# Patient Record
Sex: Female | Born: 1985 | Race: White | Hispanic: No | Marital: Single | Smoking: Never smoker
Health system: Southern US, Community
[De-identification: ages and names within clinical notes are randomized; demographics above are authoritative.]

---

## 2014-01-30 ENCOUNTER — Ambulatory Visit (INDEPENDENT_AMBULATORY_CARE_PROVIDER_SITE_OTHER): Payer: BC Managed Care – PPO | Admitting: Family Medicine

## 2014-01-30 VITALS — BP 116/63 | HR 46 | Temp 98.6°F | Resp 14 | Ht 65.0 in | Wt 128.8 lb

## 2014-01-30 DIAGNOSIS — Z0000 Encounter for general adult medical examination without abnormal findings: Secondary | ICD-10-CM

## 2014-01-30 DIAGNOSIS — Z113 Encounter for screening for infections with a predominantly sexual mode of transmission: Secondary | ICD-10-CM

## 2014-01-30 DIAGNOSIS — Z124 Encounter for screening for malignant neoplasm of cervix: Secondary | ICD-10-CM

## 2014-01-30 NOTE — Progress Notes (Signed)
Subjective:  This chart was scribed for Amanda Sorenson, MD by Andrew Au, ED Scribe. This patient was seen in room 8 and the patient's care was started at 12:23 PM.  Patient ID: Amanda Bass, female    DOB: April 08, 1986, 28 y.o.   MRN: [id number]  HPI Chief Complaint  Patient presents with  . Annual Exam    insurance physical   HPI Comments: Amanda Bass is a 28 y.o. female who presents to the [facility] for a insurance physical. Pt is new to the area and does not have a physician in the area. Pt denies hospitalization and surgeries. She denies cancers in her family.  Pt last pap smear was 3 years ago which was normal. Pt is on mirena IUD.  Pt takes multivitamin gummies Pt is unsure of last TDAP. Pt is unsure if she UTD on gardisil.  Pt has not had blood work in a while   Pt has eaten today.   History reviewed. No pertinent past medical history. History reviewed. No pertinent past surgical history. No current outpatient prescriptions on file prior to visit.   No current facility-administered medications on file prior to visit.   Allergies  Allergen Reactions  . Amoxicillin Hives  . Lotrimin [Clotrimazole] Hives   History reviewed. No pertinent family history. History   Social History  . Marital Status: Single    Spouse Name: N/A    Number of Children: N/A  . Years of Education: N/A   Social History Main Topics  . Smoking status: Never Smoker   . Smokeless tobacco: Never Used  . Alcohol Use: Yes  . Drug Use: No  . Sexual Activity: None   Other Topics Concern  . None   Social History Narrative  . None    Review of Systems  All other systems reviewed and are negative.  BP 116/63  Pulse 46  Temp(Src) 98.6 F (37 C) (Oral)  Resp 14  Ht 5\' 5"  (1.651 m)  Wt 128 lb 12.8 oz (58.423 kg)  BMI 21.43 kg/m2  SpO2 100% Objective:   Physical Exam  Nursing note and vitals reviewed. Constitutional: She is oriented to person, place, and  time. She appears well-developed and well-nourished. No distress.  HENT:  Head: Normocephalic and atraumatic.  Right Ear: Hearing, tympanic membrane, external ear and ear canal normal.  Left Ear: Hearing, tympanic membrane, external ear and ear canal normal.  Mouth/Throat: Uvula is midline, oropharynx is clear and moist and mucous membranes are normal.  Eyes: Conjunctivae and EOM are normal. Pupils are equal, round, and reactive to light.  Neck: Normal range of motion. Neck supple.  Cardiovascular: Normal rate, regular rhythm, S1 normal, S2 normal and normal heart sounds.  Exam reveals no friction rub.   No murmur heard. Pulmonary/Chest: Effort normal and breath sounds normal. No respiratory distress. She has no wheezes. She has no rales. She exhibits no tenderness.  Abdominal: Soft. Bowel sounds are normal. She exhibits no distension and no mass. There is no tenderness. There is no rebound and no guarding.  Genitourinary: Vagina normal and uterus normal. No breast swelling, tenderness, discharge or bleeding. Cervix exhibits no motion tenderness, no discharge and no friability. Right adnexum displays no mass, no tenderness and no fullness. Left adnexum displays no mass, no tenderness and no fullness.  IUD string are in place.  Musculoskeletal: Normal range of motion.  Lymphadenopathy:    She has no cervical adenopathy.  Neurological: She is alert and  oriented to person, place, and time.  Skin: Skin is warm and dry.  Psychiatric: She has a normal mood and affect. Her behavior is normal.   Assessment & Plan:  Routine general medical examination at a health care facility  Screening for cervical cancer - Plan: Pap IG, CT/NG w/ reflex HPV when ASC-U  Screening for STD (sexually transmitted disease) - Plan: Pap IG, CT/NG w/ reflex HPV when ASC-U    I personally performed the services described in this documentation, which was scribed in my presence. The recorded information has been reviewed  and considered, and addended by me as needed.  Amanda SorensonEva Oniel Meleski, MD MPH

## 2014-01-30 NOTE — Patient Instructions (Signed)
We tested for precancerous changes in your cervix as well as gonorrhea, chlamydia, bacterial vaginosis, trichomonas, and yeast. We did NOT do the blood tests for HIV, syphilis, herpes, or hepatitis. If you want to have these blood tests done at any time don't hesitate to come back. There are several good gyn practices in town but in 2 years when you need your IUD changed you can ask your family and friends. I think that [facility] and the other providers she works with are the best.  Keeping You Healthy  Get These Tests 1. Blood Pressure- Have your blood pressure checked once a year by your health care provider.  Normal blood pressure is 120/80. 2. Weight- Have your body mass index (BMI) calculated to screen for obesity.  BMI is measure of body fat based on height and weight.  You can also calculate your own BMI at https://www.west-esparza.com/www.nhlbisupport.com/bmi/. 3. Cholesterol- Have your cholesterol checked every 5 years starting at age 27 then yearly starting at age 27. 4. Chlamydia, HIV, and other sexually transmitted diseases- Get screened every year until age 27, then within three months of each new sexual provider. 5. Pap Smear- Every 1-3 years; discuss with your health care provider. 6. Mammogram- Every year starting at age 27  Take these medicines  Calcium with Vitamin D-Your body needs 1200 mg of Calcium each day and [phone] IU of Vitamin D daily.  Your body can only absorb 500 mg of Calcium at a time so Calcium must be taken in 2 or 3 divided doses throughout the day.  Multivitamin with folic acid- Once daily if it is possible for you to become pregnant.  Get these Immunizations  Gardasil-Series of three doses; prevents HPV related illness such as genital warts and cervical cancer.  Menactra-Single dose; prevents meningitis.  Tetanus shot- Every 10 years.  Flu shot-Every year.  Take these steps 1. Do not smoke-Your healthcare provider can help you quit.  For tips  on how to quit go to www.smokefree.gov or call 1-800 QUITNOW. 2. Be physically active- Exercise 5 days a week for at least 30 minutes.  If you are not already physically active, start slow and gradually work up to 30 minutes of moderate physical activity.  Examples of moderate activity include walking briskly, dancing, swimming, bicycling, etc. 3. Breast Cancer- A self breast exam every month is important for early detection of breast cancer.  For more information and instruction on self breast exams, ask your healthcare provider or [place].eswww.womenshealth.gov/faq/breast-self-exam.cfm. 4. Eat a healthy diet- Eat a variety of healthy foods such as fruits, vegetables, whole grains, low fat milk, low fat cheeses, yogurt, lean meats, poultry and fish, beans, nuts, tofu, etc.  For more information go to www. Thenutritionsource.org 5. Drink alcohol in moderation- Limit alcohol intake to one drink or less per day. Never drink and drive. 6. Depression- Your emotional health is as important as your physical health.  If you're feeling down or losing interest in things you normally enjoy please talk to your healthcare provider about being screened for depression. 7. Dental visit- Brush and floss your teeth twice daily; visit your dentist twice a year. 8. Eye doctor- Get an eye exam at least every 2 years. 9. Helmet use- Always wear a helmet when riding a bicycle, motorcycle, rollerblading or skateboarding. 10. Safe sex- If you may be exposed to sexually transmitted infections, use a condom. 11. Seat belts- Seat belts can save your live; always wear one. 12. Smoke/Carbon Monoxide  detectors- These detectors need to be installed on the appropriate level of your home. Replace batteries at least once a year. 13. Skin cancer- When out in the sun please cover up and use sunscreen 15 SPF or higher. 14. Violence- If anyone is threatening or hurting you, please tell your healthcare provider.

## 2014-01-31 LAB — PAP IG, CT-NG, RFX HPV ASCU
Chlamydia Probe Amp: NEGATIVE
GC PROBE AMP: NEGATIVE

## 2014-02-07 ENCOUNTER — Encounter: Payer: Self-pay | Admitting: Family Medicine

## 2016-09-18 DIAGNOSIS — S6990XA Unspecified injury of unspecified wrist, hand and finger(s), initial encounter: Secondary | ICD-10-CM | POA: Diagnosis not present

## 2016-09-18 DIAGNOSIS — R21 Rash and other nonspecific skin eruption: Secondary | ICD-10-CM | POA: Diagnosis not present

## 2016-09-26 DIAGNOSIS — L42 Pityriasis rosea: Secondary | ICD-10-CM | POA: Diagnosis not present

## 2016-09-27 DIAGNOSIS — M542 Cervicalgia: Secondary | ICD-10-CM | POA: Diagnosis not present

## 2016-09-27 DIAGNOSIS — G5621 Lesion of ulnar nerve, right upper limb: Secondary | ICD-10-CM | POA: Diagnosis not present

## 2016-09-27 DIAGNOSIS — G5622 Lesion of ulnar nerve, left upper limb: Secondary | ICD-10-CM | POA: Diagnosis not present

## 2016-09-27 DIAGNOSIS — M79644 Pain in right finger(s): Secondary | ICD-10-CM | POA: Diagnosis not present

## 2016-09-30 ENCOUNTER — Other Ambulatory Visit: Payer: Self-pay | Admitting: Orthopedic Surgery

## 2016-09-30 DIAGNOSIS — S63408A Traumatic rupture of unspecified ligament of other finger at metacarpophalangeal and interphalangeal joint, initial encounter: Secondary | ICD-10-CM

## 2016-10-17 ENCOUNTER — Other Ambulatory Visit: Payer: Self-pay

## 2016-11-05 ENCOUNTER — Inpatient Hospital Stay
Admission: RE | Admit: 2016-11-05 | Discharge: 2016-11-05 | Disposition: A | Discharge status: Home / Self Care | Payer: Self-pay | Source: Ambulatory Visit | Attending: Orthopedic Surgery | Admitting: Orthopedic Surgery

## 2016-11-05 ENCOUNTER — Other Ambulatory Visit: Payer: Self-pay

## 2016-11-14 ENCOUNTER — Other Ambulatory Visit: Payer: Self-pay | Admitting: Obstetrics & Gynecology

## 2016-11-14 ENCOUNTER — Other Ambulatory Visit (HOSPITAL_COMMUNITY)
Admission: RE | Admit: 2016-11-14 | Discharge: 2016-11-14 | Disposition: A | Discharge status: Home / Self Care | Payer: BLUE CROSS/BLUE SHIELD | Source: Ambulatory Visit | Attending: Obstetrics & Gynecology | Admitting: Obstetrics & Gynecology

## 2016-11-14 DIAGNOSIS — Z1151 Encounter for screening for human papillomavirus (HPV): Secondary | ICD-10-CM | POA: Insufficient documentation

## 2016-11-14 DIAGNOSIS — Z01419 Encounter for gynecological examination (general) (routine) without abnormal findings: Secondary | ICD-10-CM | POA: Insufficient documentation

## 2016-11-21 LAB — CYTOLOGY - PAP
Diagnosis: NEGATIVE
HPV (WINDOPATH): NOT DETECTED

## 2017-11-20 DIAGNOSIS — Z01419 Encounter for gynecological examination (general) (routine) without abnormal findings: Secondary | ICD-10-CM | POA: Diagnosis not present

## 2017-12-17 DIAGNOSIS — M79605 Pain in left leg: Secondary | ICD-10-CM | POA: Diagnosis not present

## 2017-12-26 DIAGNOSIS — M79605 Pain in left leg: Secondary | ICD-10-CM | POA: Diagnosis not present

## 2018-01-01 DIAGNOSIS — M79605 Pain in left leg: Secondary | ICD-10-CM | POA: Diagnosis not present

## 2018-02-19 DIAGNOSIS — M79605 Pain in left leg: Secondary | ICD-10-CM | POA: Diagnosis not present

## 2018-02-26 DIAGNOSIS — M79605 Pain in left leg: Secondary | ICD-10-CM | POA: Diagnosis not present

## 2018-03-05 DIAGNOSIS — M79605 Pain in left leg: Secondary | ICD-10-CM | POA: Diagnosis not present

## 2018-03-12 DIAGNOSIS — M79605 Pain in left leg: Secondary | ICD-10-CM | POA: Diagnosis not present

## 2018-03-19 DIAGNOSIS — M79605 Pain in left leg: Secondary | ICD-10-CM | POA: Diagnosis not present

## 2018-05-22 ENCOUNTER — Ambulatory Visit (INDEPENDENT_AMBULATORY_CARE_PROVIDER_SITE_OTHER): Payer: BLUE CROSS/BLUE SHIELD | Admitting: Family Medicine

## 2018-05-22 ENCOUNTER — Other Ambulatory Visit (INDEPENDENT_AMBULATORY_CARE_PROVIDER_SITE_OTHER): Payer: Self-pay

## 2018-05-22 ENCOUNTER — Ambulatory Visit (INDEPENDENT_AMBULATORY_CARE_PROVIDER_SITE_OTHER): Payer: Self-pay

## 2018-05-22 ENCOUNTER — Encounter (INDEPENDENT_AMBULATORY_CARE_PROVIDER_SITE_OTHER): Payer: Self-pay | Admitting: Family Medicine

## 2018-05-22 ENCOUNTER — Other Ambulatory Visit (INDEPENDENT_AMBULATORY_CARE_PROVIDER_SITE_OTHER): Payer: Self-pay | Admitting: Family Medicine

## 2018-05-22 DIAGNOSIS — M25561 Pain in right knee: Secondary | ICD-10-CM

## 2018-05-22 DIAGNOSIS — M545 Low back pain: Secondary | ICD-10-CM | POA: Diagnosis not present

## 2018-05-22 DIAGNOSIS — M25562 Pain in left knee: Principal | ICD-10-CM

## 2018-05-22 NOTE — Progress Notes (Signed)
Office Visit Note   Patient: Amanda Bass           Date of Birth: [birth year]           MRN: [id number] Visit Date: 05/22/2018 Requested by: No referring provider defined for this encounter. PCP: Blair HeysEhinger, Robert, MD  Subjective: Chief Complaint  Patient presents with  . Right Knee - Pain    Pain x 1 year and worsening - started with running.  . Left Knee - Pain    Pain x 1 year and worsening.  Did have acute, sharp pains in lateral knee approximately 7 months ago.  That pain got better.    HPI: She is a 32 year old seen at the request of Kathlyn SacramentoLogan Barbour for bilateral thigh pain.  Symptoms started about a year ago on a mild basis, no injury.  She is a runner and has been doing this for the past 5 years, but never had any pain until now.  Without any change in her activities, she started feeling pain in the lateral thigh of both legs radiating toward the knees.  In the summer, her pain became acutely worse so she stopped her running activities and started treatment with physical therapy.  She stayed away from running for about 5 months and her pain persisted.  She has not had any swelling in her knees or other joints, no pain in her hip joints, no pain in her lumbar spine.  No other unusual symptoms.  She tried different running shoes with no improvement.  She states that the pain occurs whether sitting, standing, or moving.  She feels better when she is lying down.  No fevers, chills, unintentional weight change.  No history of medical conditions in the past.  No family history of rheumatologic disease or other worrisome abnormalities.               ROS: Other systems were reviewed and are negative.  Objective: Vital Signs: There were no vitals taken for this visit.  Physical Exam:  Low back: No scoliosis, good flexibility.  Negative stork test.  No tenderness along the lumbar spine or SI joints, or in the sciatic notch areas.  Lower extremity strength and reflexes are normal. Hips:  Good range of motion with internal and external rotation.  No pain with these maneuvers.  No tenderness over the greater trochanter. Knees: Normal Q angles, slight lateral patellar tethering.  Ligaments are stable, no joint effusions.  No joint line tenderness and no pain or click with McMurray's bilaterally.  Both legs are tender at the mid to distal iliotibial band but not at the lateral femoral condyles.  Pain stops before that.  Imaging: X-rays lumbar spine: Normal disc spaces, normal alignment, no congenital deformities, no spondylolisthesis.  No sign of neoplasm or other abnormality.  X-rays of both femurs: Hip joints look good, some tiny calcifications on the lateral aspect of both which could represent small labral tears.  No sign of stress fracture in the femurs.  No sign of neoplasm.    Assessment & Plan: 1.  Bilateral lateral thigh pain, etiology uncertain.  Could be iliotibial band but is not a typical presentation. -She had some labs drawn last year and will send me those results.  Depending on what was drawn, we might draw some more.  If all are unrevealing, then possibly additional work-up such as MRI scan.   Follow-Up Instructions: No follow-ups on file.      Procedures: No procedures performed  No  notes on file    PMFS History: There are no active problems to display for this patient.  History reviewed. No pertinent past medical history.  History reviewed. No pertinent family history.  History reviewed. No pertinent surgical history. Social History   Occupational History  . Not on file  Tobacco Use  . Smoking status: Never Smoker  . Smokeless tobacco: Never Used  Substance and Sexual Activity  . Alcohol use: Yes  . Drug use: No  . Sexual activity: Not on file

## 2018-05-23 ENCOUNTER — Telehealth (INDEPENDENT_AMBULATORY_CARE_PROVIDER_SITE_OTHER): Payer: Self-pay | Admitting: Family Medicine

## 2018-05-23 LAB — THYROID PANEL WITH TSH
Free Thyroxine Index: 2.5 (ref 1.4–3.8)
T3 Uptake: 33 % (ref 22–35)
T4, Total: 7.6 ug/dL (ref 5.1–11.9)
TSH: 1.14 mIU/L

## 2018-05-23 LAB — VITAMIN D 25 HYDROXY (VIT D DEFICIENCY, FRACTURES): Vit D, 25-Hydroxy: 18 ng/mL — ABNORMAL LOW (ref 30–100)

## 2018-05-23 LAB — C-REACTIVE PROTEIN: CRP: <0.2 mg/L (ref <8.0)

## 2018-05-23 LAB — RHEUMATOID FACTOR: Rheumatoid fact SerPl-aCnc: <14 IU/mL (ref <14)

## 2018-05-23 NOTE — Telephone Encounter (Signed)
Vitamin D is low at 18.  We want this to be in the 50-80 range.  This could be contributing to pain.  I recommend taking vitamin D3, 5,000 IU tablets, two daily (10,000 IU total) for 3 months and then 1 daily long-term after that.  Recheck level in 3-6 months to be sure it's in range.  ANA still pending.  Others normal so far.

## 2018-05-25 LAB — ANA: Anti Nuclear Antibody(ANA): NEGATIVE

## 2018-05-26 ENCOUNTER — Telehealth (INDEPENDENT_AMBULATORY_CARE_PROVIDER_SITE_OTHER): Payer: Self-pay | Admitting: Family Medicine

## 2018-05-26 NOTE — Telephone Encounter (Signed)
Final result (ANA) was negative/normal.   

## 2018-07-28 ENCOUNTER — Encounter (INDEPENDENT_AMBULATORY_CARE_PROVIDER_SITE_OTHER): Payer: Self-pay | Admitting: Family Medicine

## 2018-07-28 DIAGNOSIS — E559 Vitamin D deficiency, unspecified: Secondary | ICD-10-CM

## 2018-07-30 NOTE — Telephone Encounter (Signed)
Patient came by the office to get her Vitamin D drawn per Dr. Prince RomeHilts.  When patient arrived, she was not on the schedule as a nurse visit and was advised to come by the office on Friday.  Patient stated that she may not be able to come back by tomorrow and requested that someone message her when she needs to come by.  Thank you.

## 2018-07-31 ENCOUNTER — Ambulatory Visit (INDEPENDENT_AMBULATORY_CARE_PROVIDER_SITE_OTHER): Payer: BLUE CROSS/BLUE SHIELD

## 2018-07-31 DIAGNOSIS — E559 Vitamin D deficiency, unspecified: Secondary | ICD-10-CM

## 2018-07-31 NOTE — Progress Notes (Signed)
Vitamin D Hydroxy 25 drawn per Dr. Prince Rome.

## 2018-07-31 NOTE — Addendum Note (Signed)
Addended by: Lillia Carmel on: 07/31/2018 07:40 AM   Modules accepted: Orders

## 2018-08-01 ENCOUNTER — Telehealth (INDEPENDENT_AMBULATORY_CARE_PROVIDER_SITE_OTHER): Payer: Self-pay | Admitting: Family Medicine

## 2018-08-01 DIAGNOSIS — M25552 Pain in left hip: Secondary | ICD-10-CM

## 2018-08-01 DIAGNOSIS — M79605 Pain in left leg: Secondary | ICD-10-CM

## 2018-08-01 LAB — VITAMIN D 25 HYDROXY (VIT D DEFICIENCY, FRACTURES): Vit D, 25-Hydroxy: 46 ng/mL (ref 30–100)

## 2018-08-01 NOTE — Telephone Encounter (Signed)
Vitamin D is much better at 46.

## 2018-08-03 NOTE — Addendum Note (Signed)
Addended by: Lillia Carmel on: 08/03/2018 08:27 AM   Modules accepted: Orders

## 2018-08-15 ENCOUNTER — Ambulatory Visit
Admission: RE | Admit: 2018-08-15 | Discharge: 2018-08-15 | Disposition: A | Discharge status: Home / Self Care | Payer: BLUE CROSS/BLUE SHIELD | Source: Ambulatory Visit | Attending: Family Medicine | Admitting: Family Medicine

## 2018-08-15 DIAGNOSIS — R600 Localized edema: Secondary | ICD-10-CM | POA: Diagnosis not present

## 2018-08-15 DIAGNOSIS — M79605 Pain in left leg: Secondary | ICD-10-CM

## 2018-08-15 DIAGNOSIS — M25552 Pain in left hip: Secondary | ICD-10-CM | POA: Diagnosis not present

## 2018-08-17 ENCOUNTER — Telehealth (INDEPENDENT_AMBULATORY_CARE_PROVIDER_SITE_OTHER): Payer: Self-pay | Admitting: Family Medicine

## 2018-08-17 NOTE — Telephone Encounter (Signed)
MRI shows normal-appearing hip joint.  There is soft tissue swelling/edema where the IT band crosses over the femur bone.  This could be the source of pain.

## 2018-08-21 ENCOUNTER — Encounter (INDEPENDENT_AMBULATORY_CARE_PROVIDER_SITE_OTHER): Payer: Self-pay | Admitting: Family Medicine

## 2018-08-21 ENCOUNTER — Ambulatory Visit (INDEPENDENT_AMBULATORY_CARE_PROVIDER_SITE_OTHER): Payer: BLUE CROSS/BLUE SHIELD | Admitting: Family Medicine

## 2018-08-21 DIAGNOSIS — M25552 Pain in left hip: Secondary | ICD-10-CM

## 2018-08-21 DIAGNOSIS — M79604 Pain in right leg: Secondary | ICD-10-CM | POA: Diagnosis not present

## 2018-08-21 DIAGNOSIS — M79605 Pain in left leg: Secondary | ICD-10-CM

## 2018-08-21 NOTE — Progress Notes (Signed)
   Office Visit Note   Patient: Amanda Bass           Date of Birth: 13-Jan-1986           MRN: [id number] Visit Date: 08/21/2018 Requested by: Blair Heys, MD [address] [place], [place] [address] PCP: Blair Heys, MD  Subjective: Chief Complaint  Patient presents with  . bilateral hip pain    HPI: She is here for follow-up chronic left greater than right leg pain.  Recent MRI of her left hip showed some soft tissue swelling near the greater trochanter possibly related to IT band friction.  Otherwise unremarkable.  She continues to have left greater than right pain in her legs, generally it starts in the left anterior lateral distal thigh and then involves the lateral aspect of her hip above the greater trochanter.  She is no longer able to run for distance because of her pain.  She is very frustrated by her chronic symptoms.  Denies any low back pain, has not noticed any numbness or weakness in the legs, no symptoms below the knees.              ROS: Noncontributory  Objective: Vital Signs: There were no vitals taken for this visit.  Physical Exam:  Low back: No tenderness to palpation lumbar spine.  Stork test is negative.  Lower extremity strength and reflexes are normal except for left ankle dorsiflexion which is weak at 4/5. Hips: Slightly tender at the greater trochanter.  Slightly tender near the tensor fascia lata muscles.   Imaging: None today.  Assessment & Plan: 1.  Persistent left greater than right leg pain with left ankle dorsiflexion weakness, now suspicious for lumbar foraminal stenosis -Elected to pursue lumbar MRI scan.  Follow-up after that to go over the results.     Procedures: No procedures performed  No notes on file     PMFS History: There are no active problems to display for this patient.  History reviewed. No pertinent past medical history.  History reviewed. No pertinent family history.  History reviewed. No  pertinent surgical history. Social History   Occupational History  . Not on file  Tobacco Use  . Smoking status: Never Smoker  . Smokeless tobacco: Never Used  Substance and Sexual Activity  . Alcohol use: Yes  . Drug use: No  . Sexual activity: Not on file

## 2018-08-26 ENCOUNTER — Other Ambulatory Visit: Payer: Self-pay

## 2018-08-26 ENCOUNTER — Ambulatory Visit
Admission: RE | Admit: 2018-08-26 | Discharge: 2018-08-26 | Disposition: A | Discharge status: Home / Self Care | Payer: BLUE CROSS/BLUE SHIELD | Source: Ambulatory Visit | Attending: Family Medicine | Admitting: Family Medicine

## 2018-08-26 DIAGNOSIS — M5136 Other intervertebral disc degeneration, lumbar region: Secondary | ICD-10-CM | POA: Diagnosis not present

## 2018-08-26 DIAGNOSIS — M79605 Pain in left leg: Secondary | ICD-10-CM

## 2018-08-26 DIAGNOSIS — M79604 Pain in right leg: Secondary | ICD-10-CM

## 2018-08-26 DIAGNOSIS — M25552 Pain in left hip: Secondary | ICD-10-CM

## 2018-08-27 ENCOUNTER — Telehealth (INDEPENDENT_AMBULATORY_CARE_PROVIDER_SITE_OTHER): Payer: Self-pay | Admitting: Family Medicine

## 2018-08-27 NOTE — Telephone Encounter (Signed)
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  Left side looks ok, but it's possible that the disc is shifting depending on your activities and could be causing the left-sided issues as well.

## 2018-09-03 DIAGNOSIS — M545 Low back pain: Secondary | ICD-10-CM | POA: Diagnosis not present

## 2019-01-18 DIAGNOSIS — R51 Headache: Secondary | ICD-10-CM | POA: Diagnosis not present

## 2019-01-18 DIAGNOSIS — Z1159 Encounter for screening for other viral diseases: Secondary | ICD-10-CM | POA: Diagnosis not present

## 2019-02-04 DIAGNOSIS — Z01419 Encounter for gynecological examination (general) (routine) without abnormal findings: Secondary | ICD-10-CM | POA: Diagnosis not present

## 2019-03-04 DIAGNOSIS — S61352A Open bite of right middle finger with damage to nail, initial encounter: Secondary | ICD-10-CM | POA: Diagnosis not present

## 2019-05-03 ENCOUNTER — Other Ambulatory Visit: Payer: Self-pay

## 2019-05-03 DIAGNOSIS — Z20822 Contact with and (suspected) exposure to covid-19: Secondary | ICD-10-CM

## 2019-05-05 LAB — NOVEL CORONAVIRUS, NAA: SARS-CoV-2, NAA: NOT DETECTED

## 2019-05-15 DIAGNOSIS — Z20828 Contact with and (suspected) exposure to other viral communicable diseases: Secondary | ICD-10-CM | POA: Diagnosis not present

## 2019-05-18 ENCOUNTER — Other Ambulatory Visit: Payer: Self-pay

## 2019-05-18 DIAGNOSIS — Z20822 Contact with and (suspected) exposure to covid-19: Secondary | ICD-10-CM

## 2019-05-20 LAB — NOVEL CORONAVIRUS, NAA: SARS-CoV-2, NAA: DETECTED — AB

## 2019-07-01 DIAGNOSIS — F411 Generalized anxiety disorder: Secondary | ICD-10-CM | POA: Diagnosis not present

## 2019-07-01 DIAGNOSIS — F331 Major depressive disorder, recurrent, moderate: Secondary | ICD-10-CM | POA: Diagnosis not present

## 2019-07-16 DIAGNOSIS — F331 Major depressive disorder, recurrent, moderate: Secondary | ICD-10-CM | POA: Diagnosis not present

## 2019-07-16 DIAGNOSIS — F411 Generalized anxiety disorder: Secondary | ICD-10-CM | POA: Diagnosis not present

## 2019-08-04 ENCOUNTER — Other Ambulatory Visit: Payer: Self-pay

## 2019-08-04 ENCOUNTER — Ambulatory Visit (INDEPENDENT_AMBULATORY_CARE_PROVIDER_SITE_OTHER): Payer: BC Managed Care – PPO | Admitting: Family Medicine

## 2019-08-04 ENCOUNTER — Encounter: Payer: Self-pay | Admitting: Family Medicine

## 2019-08-04 DIAGNOSIS — G5623 Lesion of ulnar nerve, bilateral upper limbs: Secondary | ICD-10-CM

## 2019-08-04 NOTE — Progress Notes (Signed)
   Office Visit Note   Patient: Amanda Bass           Date of Birth: Oct 28, 1985           MRN: [id number] Visit Date: 08/04/2019 Requested by: Blair Heys, MD [address] [place],  [place] [address] PCP: Blair Heys, MD  Subjective: Chief Complaint  Patient presents with  . bil wrist pain (r>l),with numbness in thumbs    HPI: She is here with bilateral hand pain and numbness.  Symptoms started 5 or 6 years ago.  She was diagnosed by nerve studies as having cubital tunnel syndrome and carpal tunnel syndrome.  Eventually she had cortisone injections in the cubital tunnel on both sides and her symptoms improved quite a bit.  Over the past year her symptoms have gotten worse, right side greater than left.  She is right-hand dominant.  She gets occasional numbness in her thumbs, but most of her symptoms are in the palm of her hand with symptoms starting near the elbow.  She has not had any weakness, denies any neck pain associated with this.              ROS:   All other systems were reviewed and are negative.  Objective: Vital Signs: There were no vitals taken for this visit.  Physical Exam:  General:  Alert and oriented, in no acute distress. Pulm:  Breathing unlabored. Psy:  Normal mood, congruent affect. Skin: No visible rash. Arms: She has full range of motion of the elbows and wrists pain-free.  Neck range of motion is full with negative Spurling's test.  Upper extremity strength and reflexes are normal bilaterally.  She has no atrophy of the muscles of her hands.  Positive Tinel's at the ulnar groove on both sides and positive ulnar compression test at the elbows recreating her palm symptoms.  Mildly positive Phalen's test in both wrists causing tingling in the thumbs.  Imaging: None today  Assessment & Plan: 1.  Chronic bilateral arm pain with history of cubital tunnel syndrome documented by nerve studies as well as mild carpal tunnel  syndrome. -Discussed options with her and elected to refer her to PT and hand for splinting and modalities. -She will use ibuprofen as needed. - Persist or worsen, could contemplate repeat cortisone injection or new nerve studies.      Procedures: No procedures performed  No notes on file     PMFS History: There are no problems to display for this patient.  History reviewed. No pertinent past medical history.  History reviewed. No pertinent family history.  History reviewed. No pertinent surgical history. Social History   Occupational History  . Not on file  Tobacco Use  . Smoking status: Never Smoker  . Smokeless tobacco: Never Used  Substance and Sexual Activity  . Alcohol use: Yes  . Drug use: No  . Sexual activity: Not on file

## 2019-08-12 DIAGNOSIS — M6281 Muscle weakness (generalized): Secondary | ICD-10-CM | POA: Diagnosis not present

## 2019-08-12 DIAGNOSIS — M25521 Pain in right elbow: Secondary | ICD-10-CM | POA: Diagnosis not present

## 2019-08-12 DIAGNOSIS — R202 Paresthesia of skin: Secondary | ICD-10-CM | POA: Diagnosis not present

## 2019-08-12 DIAGNOSIS — G5623 Lesion of ulnar nerve, bilateral upper limbs: Secondary | ICD-10-CM | POA: Diagnosis not present

## 2019-08-18 ENCOUNTER — Telehealth: Payer: Self-pay | Admitting: Family Medicine

## 2019-08-18 NOTE — Telephone Encounter (Signed)
Faxed order for P[facility]. to [facility]. Patient arrived without order. Connie called.

## 2019-08-19 DIAGNOSIS — M25521 Pain in right elbow: Secondary | ICD-10-CM | POA: Diagnosis not present

## 2019-08-19 DIAGNOSIS — F411 Generalized anxiety disorder: Secondary | ICD-10-CM | POA: Diagnosis not present

## 2019-08-19 DIAGNOSIS — F331 Major depressive disorder, recurrent, moderate: Secondary | ICD-10-CM | POA: Diagnosis not present

## 2019-08-19 DIAGNOSIS — M6281 Muscle weakness (generalized): Secondary | ICD-10-CM | POA: Diagnosis not present

## 2019-08-19 DIAGNOSIS — R202 Paresthesia of skin: Secondary | ICD-10-CM | POA: Diagnosis not present

## 2019-08-19 DIAGNOSIS — G5623 Lesion of ulnar nerve, bilateral upper limbs: Secondary | ICD-10-CM | POA: Diagnosis not present

## 2019-08-20 DIAGNOSIS — M6281 Muscle weakness (generalized): Secondary | ICD-10-CM | POA: Diagnosis not present

## 2019-08-20 DIAGNOSIS — M25521 Pain in right elbow: Secondary | ICD-10-CM | POA: Diagnosis not present

## 2019-08-20 DIAGNOSIS — R202 Paresthesia of skin: Secondary | ICD-10-CM | POA: Diagnosis not present

## 2019-08-20 DIAGNOSIS — G5623 Lesion of ulnar nerve, bilateral upper limbs: Secondary | ICD-10-CM | POA: Diagnosis not present

## 2019-08-26 DIAGNOSIS — M25521 Pain in right elbow: Secondary | ICD-10-CM | POA: Diagnosis not present

## 2019-08-26 DIAGNOSIS — G5623 Lesion of ulnar nerve, bilateral upper limbs: Secondary | ICD-10-CM | POA: Diagnosis not present

## 2019-08-26 DIAGNOSIS — M6281 Muscle weakness (generalized): Secondary | ICD-10-CM | POA: Diagnosis not present

## 2019-08-26 DIAGNOSIS — R202 Paresthesia of skin: Secondary | ICD-10-CM | POA: Diagnosis not present

## 2019-08-27 DIAGNOSIS — G5623 Lesion of ulnar nerve, bilateral upper limbs: Secondary | ICD-10-CM | POA: Diagnosis not present

## 2019-08-27 DIAGNOSIS — M6281 Muscle weakness (generalized): Secondary | ICD-10-CM | POA: Diagnosis not present

## 2019-08-27 DIAGNOSIS — R202 Paresthesia of skin: Secondary | ICD-10-CM | POA: Diagnosis not present

## 2019-08-27 DIAGNOSIS — M25521 Pain in right elbow: Secondary | ICD-10-CM | POA: Diagnosis not present

## 2019-09-09 DIAGNOSIS — F411 Generalized anxiety disorder: Secondary | ICD-10-CM | POA: Diagnosis not present

## 2019-09-09 DIAGNOSIS — F331 Major depressive disorder, recurrent, moderate: Secondary | ICD-10-CM | POA: Diagnosis not present

## 2019-09-10 DIAGNOSIS — R202 Paresthesia of skin: Secondary | ICD-10-CM | POA: Diagnosis not present

## 2019-09-10 DIAGNOSIS — G5623 Lesion of ulnar nerve, bilateral upper limbs: Secondary | ICD-10-CM | POA: Diagnosis not present

## 2019-09-10 DIAGNOSIS — M25521 Pain in right elbow: Secondary | ICD-10-CM | POA: Diagnosis not present

## 2019-09-10 DIAGNOSIS — M6281 Muscle weakness (generalized): Secondary | ICD-10-CM | POA: Diagnosis not present

## 2019-09-16 DIAGNOSIS — M6281 Muscle weakness (generalized): Secondary | ICD-10-CM | POA: Diagnosis not present

## 2019-09-16 DIAGNOSIS — M25521 Pain in right elbow: Secondary | ICD-10-CM | POA: Diagnosis not present

## 2019-09-16 DIAGNOSIS — R202 Paresthesia of skin: Secondary | ICD-10-CM | POA: Diagnosis not present

## 2019-09-16 DIAGNOSIS — G5623 Lesion of ulnar nerve, bilateral upper limbs: Secondary | ICD-10-CM | POA: Diagnosis not present

## 2019-09-24 DIAGNOSIS — M6281 Muscle weakness (generalized): Secondary | ICD-10-CM | POA: Diagnosis not present

## 2019-09-24 DIAGNOSIS — R202 Paresthesia of skin: Secondary | ICD-10-CM | POA: Diagnosis not present

## 2019-09-24 DIAGNOSIS — G5623 Lesion of ulnar nerve, bilateral upper limbs: Secondary | ICD-10-CM | POA: Diagnosis not present

## 2019-09-24 DIAGNOSIS — M25521 Pain in right elbow: Secondary | ICD-10-CM | POA: Diagnosis not present

## 2019-09-27 DIAGNOSIS — Z20822 Contact with and (suspected) exposure to covid-19: Secondary | ICD-10-CM | POA: Diagnosis not present

## 2019-10-03 IMAGING — MR MR HIP*L* W/O CM
6 series · 40 of 40 positions shown · non-contrast
Comparison: None.

CLINICAL DATA: Pain and iliotibial band. Pain with walking and
running for 18 months

EXAM:
MR OF THE LEFT HIP WITHOUT CONTRAST
TECHNIQUE: Multiplanar, multisequence MR imaging was performed. No intravenous
contrast was administered.

[Series 3: T1 · coronal · 4.0mm · 1.19mm/px · 6 of 24 slices shown]
[im 1/24]
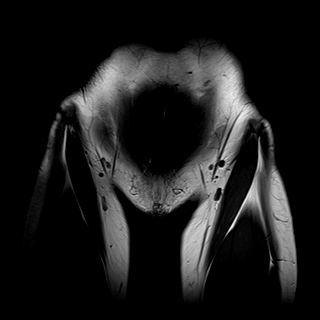
[im 5/24]
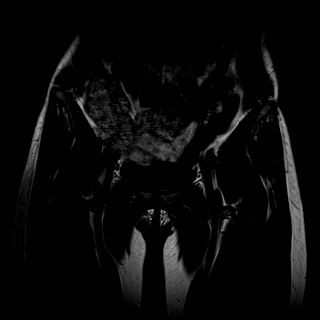
[im 10/24]
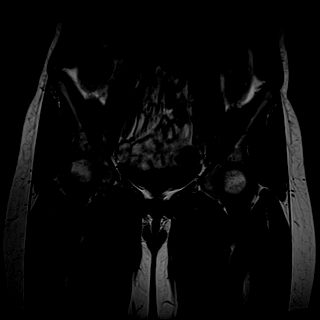
[im 14/24]
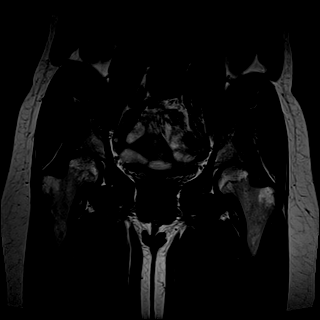
[im 19/24]
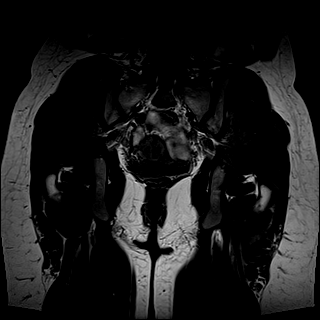
[im 24/24]
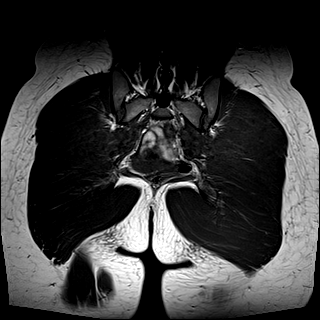

[Series 4: T2 fat-sat · coronal · 4.0mm · 1.19mm/px · 7 of 24 slices shown (1 of 2)]
[im 1/24]
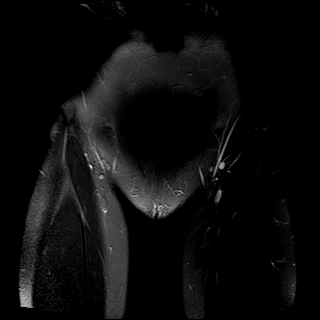
[im 4/24]
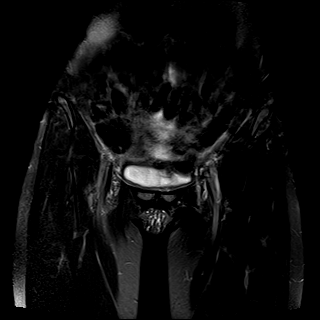
[im 8/24]
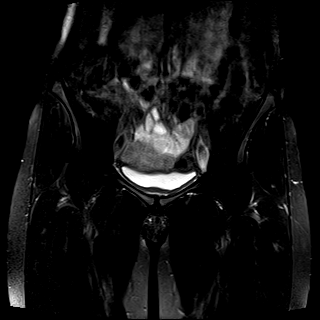
[im 12/24]
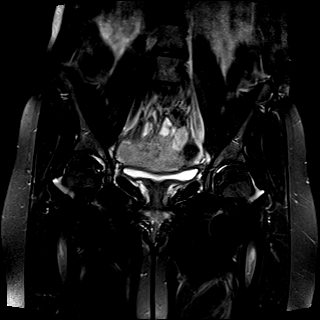
[im 16/24]
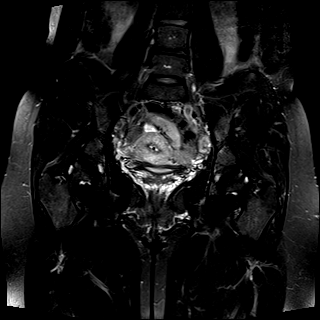
[im 20/24]
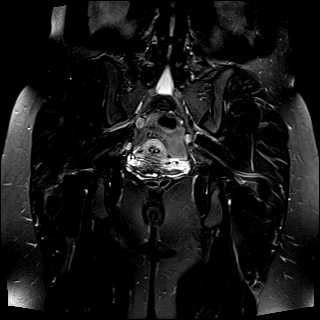
[im 24/24]
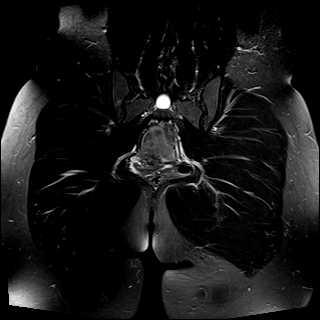

[Series 5: T2 fat-sat · axial · 4.0mm · 0.35mm/px · z∈[-95,+40]mm · 8 of 28 slices shown (2 of 2)]
[im 1/28]
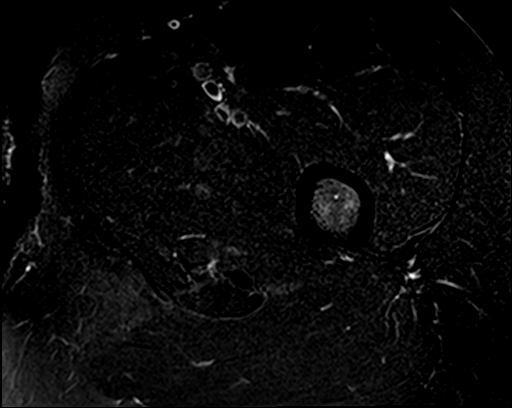
[im 4/28]
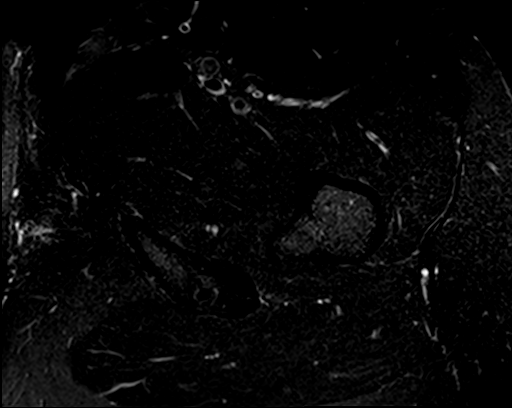
[im 8/28]
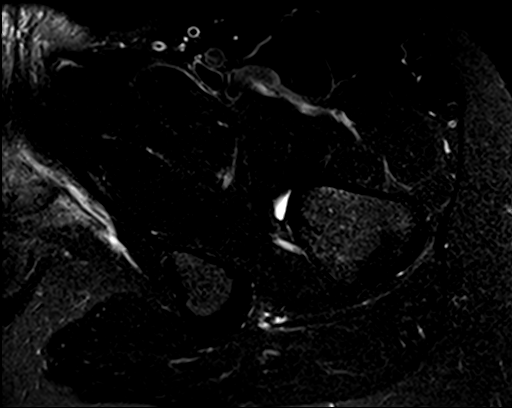
[im 12/28]
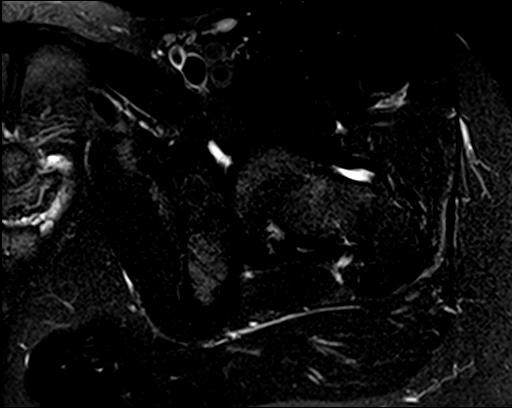
[im 16/28]
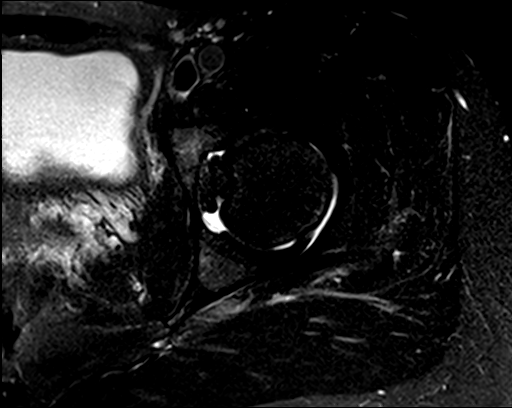
[im 20/28]
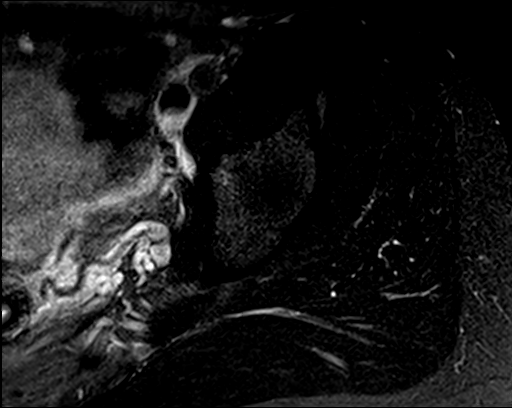
[im 24/28]
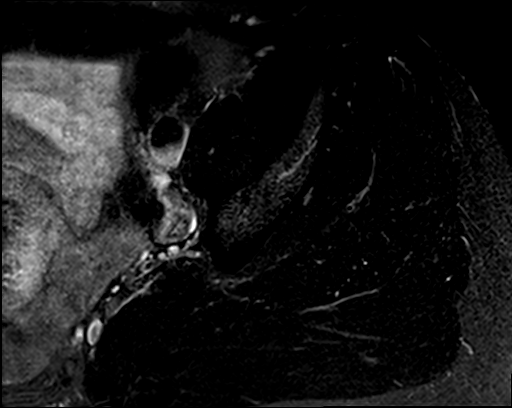
[im 28/28]
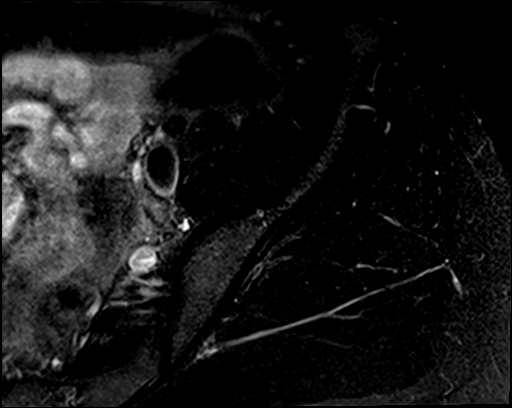

[Series 6: PD fat-sat · sagittal · 4.0mm · 0.70mm/px · 7 of 24 slices shown (1 of 3)]
[im 1/24]
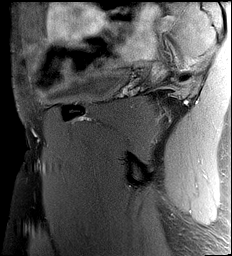
[im 4/24]
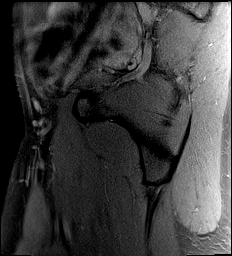
[im 8/24]
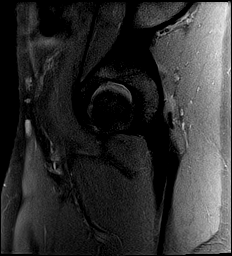
[im 12/24]
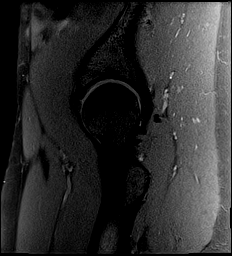
[im 16/24]
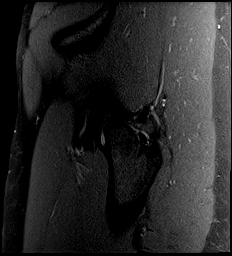
[im 20/24]
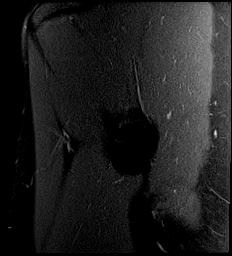
[im 24/24]
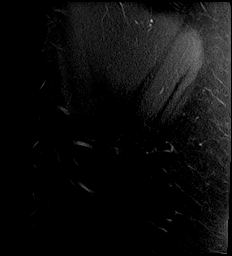

[Series 7: PD fat-sat · coronal · 4.0mm · 0.70mm/px · 5 of 19 slices shown (2 of 3)]
[im 1/19]
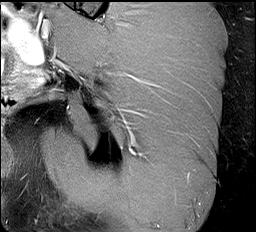
[im 5/19]
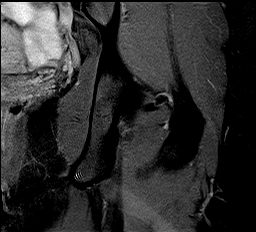
[im 10/19]
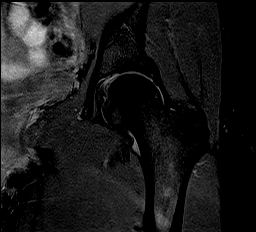
[im 14/19]
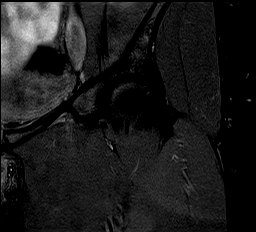
[im 19/19]
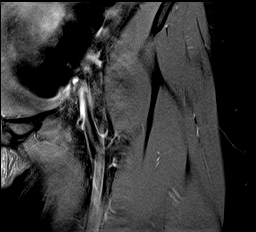

[Series 8: PD fat-sat · axial · 4.0mm · 0.70mm/px · z∈[-39,+62]mm · 7 of 24 slices shown (3 of 3)]
[im 1/24]
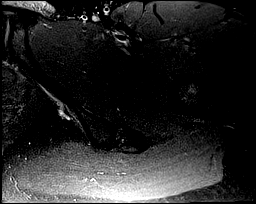
[im 4/24]
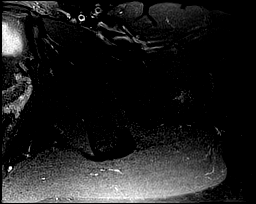
[im 8/24]
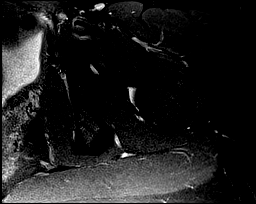
[im 12/24]
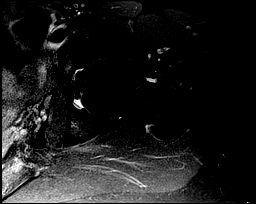
[im 16/24]
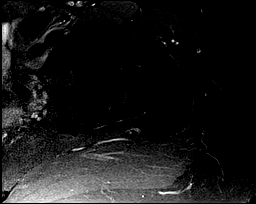
[im 20/24]
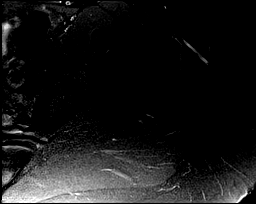
[im 24/24]
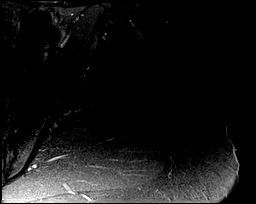

[40 of 40 positions shown; findings below may reference images not displayed]

FINDINGS: Bones: No hip fracture, dislocation or avascular necrosis. No
periosteal reaction or bone destruction. No aggressive osseous
lesion.

Normal sacrum and sacroiliac joints. No SI joint widening or erosive
changes.

Articular cartilage and labrum

Articular cartilage:  No chondral defect.

Labrum: Grossly intact, but evaluation is limited by lack of
intraarticular fluid.

Joint or bursal effusion

Joint effusion:  No hip joint effusion.  No SI joint effusion.

Bursae:  No bursa formation.

Muscles and tendons

Flexors: Normal.

Extensors: Normal.

Abductors: Mild focal thickening of the tibial band with mild
adjacent soft tissue edema concerning for inflammation adjacent to
the greater trochanter.

Adductors: Normal.

Gluteals: Normal.

Hamstrings: Normal.

Other findings

Miscellaneous: No pelvic free fluid. No fluid collection or
hematoma. No inguinal lymphadenopathy. No inguinal hernia.
IMPRESSION: 1. Mild focal thickening of the tibial band with mild adjacent soft
tissue edema concerning for inflammation adjacent to the greater
trochanter.

## 2019-10-07 DIAGNOSIS — M25521 Pain in right elbow: Secondary | ICD-10-CM | POA: Diagnosis not present

## 2019-10-07 DIAGNOSIS — R202 Paresthesia of skin: Secondary | ICD-10-CM | POA: Diagnosis not present

## 2019-10-07 DIAGNOSIS — G5623 Lesion of ulnar nerve, bilateral upper limbs: Secondary | ICD-10-CM | POA: Diagnosis not present

## 2019-10-07 DIAGNOSIS — M6281 Muscle weakness (generalized): Secondary | ICD-10-CM | POA: Diagnosis not present

## 2019-10-08 DIAGNOSIS — F331 Major depressive disorder, recurrent, moderate: Secondary | ICD-10-CM | POA: Diagnosis not present

## 2019-10-08 DIAGNOSIS — F411 Generalized anxiety disorder: Secondary | ICD-10-CM | POA: Diagnosis not present

## 2019-10-14 DIAGNOSIS — M6281 Muscle weakness (generalized): Secondary | ICD-10-CM | POA: Diagnosis not present

## 2019-10-14 DIAGNOSIS — G5623 Lesion of ulnar nerve, bilateral upper limbs: Secondary | ICD-10-CM | POA: Diagnosis not present

## 2019-10-14 DIAGNOSIS — M25521 Pain in right elbow: Secondary | ICD-10-CM | POA: Diagnosis not present

## 2019-10-14 DIAGNOSIS — R202 Paresthesia of skin: Secondary | ICD-10-CM | POA: Diagnosis not present

## 2019-10-15 ENCOUNTER — Other Ambulatory Visit: Payer: Self-pay | Admitting: Family Medicine

## 2019-10-15 DIAGNOSIS — G5623 Lesion of ulnar nerve, bilateral upper limbs: Secondary | ICD-10-CM

## 2019-10-22 DIAGNOSIS — M6281 Muscle weakness (generalized): Secondary | ICD-10-CM | POA: Diagnosis not present

## 2019-10-22 DIAGNOSIS — M25521 Pain in right elbow: Secondary | ICD-10-CM | POA: Diagnosis not present

## 2019-10-22 DIAGNOSIS — R202 Paresthesia of skin: Secondary | ICD-10-CM | POA: Diagnosis not present

## 2019-10-22 DIAGNOSIS — G5623 Lesion of ulnar nerve, bilateral upper limbs: Secondary | ICD-10-CM | POA: Diagnosis not present

## 2019-10-25 DIAGNOSIS — M25521 Pain in right elbow: Secondary | ICD-10-CM | POA: Diagnosis not present

## 2019-10-25 DIAGNOSIS — R202 Paresthesia of skin: Secondary | ICD-10-CM | POA: Diagnosis not present

## 2019-10-25 DIAGNOSIS — M6281 Muscle weakness (generalized): Secondary | ICD-10-CM | POA: Diagnosis not present

## 2019-10-25 DIAGNOSIS — G5623 Lesion of ulnar nerve, bilateral upper limbs: Secondary | ICD-10-CM | POA: Diagnosis not present

## 2019-10-29 DIAGNOSIS — R6889 Other general symptoms and signs: Secondary | ICD-10-CM | POA: Diagnosis not present

## 2019-11-03 DIAGNOSIS — R202 Paresthesia of skin: Secondary | ICD-10-CM | POA: Diagnosis not present

## 2019-11-03 DIAGNOSIS — M25521 Pain in right elbow: Secondary | ICD-10-CM | POA: Diagnosis not present

## 2019-11-03 DIAGNOSIS — M6281 Muscle weakness (generalized): Secondary | ICD-10-CM | POA: Diagnosis not present

## 2019-11-03 DIAGNOSIS — G5623 Lesion of ulnar nerve, bilateral upper limbs: Secondary | ICD-10-CM | POA: Diagnosis not present

## 2019-11-04 DIAGNOSIS — F331 Major depressive disorder, recurrent, moderate: Secondary | ICD-10-CM | POA: Diagnosis not present

## 2019-11-08 DIAGNOSIS — G5623 Lesion of ulnar nerve, bilateral upper limbs: Secondary | ICD-10-CM | POA: Diagnosis not present

## 2019-11-08 DIAGNOSIS — M6281 Muscle weakness (generalized): Secondary | ICD-10-CM | POA: Diagnosis not present

## 2019-11-08 DIAGNOSIS — R202 Paresthesia of skin: Secondary | ICD-10-CM | POA: Diagnosis not present

## 2019-11-08 DIAGNOSIS — M25521 Pain in right elbow: Secondary | ICD-10-CM | POA: Diagnosis not present

## 2019-11-10 DIAGNOSIS — M2569 Stiffness of other specified joint, not elsewhere classified: Secondary | ICD-10-CM | POA: Diagnosis not present

## 2019-11-10 DIAGNOSIS — M542 Cervicalgia: Secondary | ICD-10-CM | POA: Diagnosis not present

## 2019-11-10 DIAGNOSIS — R293 Abnormal posture: Secondary | ICD-10-CM | POA: Diagnosis not present

## 2019-11-10 DIAGNOSIS — R531 Weakness: Secondary | ICD-10-CM | POA: Diagnosis not present

## 2019-11-12 DIAGNOSIS — M542 Cervicalgia: Secondary | ICD-10-CM | POA: Diagnosis not present

## 2019-11-12 DIAGNOSIS — R531 Weakness: Secondary | ICD-10-CM | POA: Diagnosis not present

## 2019-11-12 DIAGNOSIS — M2569 Stiffness of other specified joint, not elsewhere classified: Secondary | ICD-10-CM | POA: Diagnosis not present

## 2019-11-12 DIAGNOSIS — R293 Abnormal posture: Secondary | ICD-10-CM | POA: Diagnosis not present

## 2019-11-19 DIAGNOSIS — M2569 Stiffness of other specified joint, not elsewhere classified: Secondary | ICD-10-CM | POA: Diagnosis not present

## 2019-11-19 DIAGNOSIS — R531 Weakness: Secondary | ICD-10-CM | POA: Diagnosis not present

## 2019-11-19 DIAGNOSIS — R293 Abnormal posture: Secondary | ICD-10-CM | POA: Diagnosis not present

## 2019-11-19 DIAGNOSIS — M542 Cervicalgia: Secondary | ICD-10-CM | POA: Diagnosis not present

## 2019-11-25 DIAGNOSIS — M542 Cervicalgia: Secondary | ICD-10-CM | POA: Diagnosis not present

## 2019-11-25 DIAGNOSIS — M2569 Stiffness of other specified joint, not elsewhere classified: Secondary | ICD-10-CM | POA: Diagnosis not present

## 2019-11-25 DIAGNOSIS — R293 Abnormal posture: Secondary | ICD-10-CM | POA: Diagnosis not present

## 2019-11-25 DIAGNOSIS — R531 Weakness: Secondary | ICD-10-CM | POA: Diagnosis not present

## 2019-12-01 DIAGNOSIS — R531 Weakness: Secondary | ICD-10-CM | POA: Diagnosis not present

## 2019-12-01 DIAGNOSIS — M542 Cervicalgia: Secondary | ICD-10-CM | POA: Diagnosis not present

## 2019-12-01 DIAGNOSIS — R293 Abnormal posture: Secondary | ICD-10-CM | POA: Diagnosis not present

## 2019-12-01 DIAGNOSIS — M2569 Stiffness of other specified joint, not elsewhere classified: Secondary | ICD-10-CM | POA: Diagnosis not present

## 2019-12-03 DIAGNOSIS — M542 Cervicalgia: Secondary | ICD-10-CM | POA: Diagnosis not present

## 2019-12-03 DIAGNOSIS — R531 Weakness: Secondary | ICD-10-CM | POA: Diagnosis not present

## 2019-12-03 DIAGNOSIS — R293 Abnormal posture: Secondary | ICD-10-CM | POA: Diagnosis not present

## 2019-12-03 DIAGNOSIS — M2569 Stiffness of other specified joint, not elsewhere classified: Secondary | ICD-10-CM | POA: Diagnosis not present

## 2019-12-10 DIAGNOSIS — M542 Cervicalgia: Secondary | ICD-10-CM | POA: Diagnosis not present

## 2019-12-10 DIAGNOSIS — M2569 Stiffness of other specified joint, not elsewhere classified: Secondary | ICD-10-CM | POA: Diagnosis not present

## 2019-12-10 DIAGNOSIS — R293 Abnormal posture: Secondary | ICD-10-CM | POA: Diagnosis not present

## 2019-12-10 DIAGNOSIS — R531 Weakness: Secondary | ICD-10-CM | POA: Diagnosis not present

## 2019-12-16 DIAGNOSIS — R293 Abnormal posture: Secondary | ICD-10-CM | POA: Diagnosis not present

## 2019-12-16 DIAGNOSIS — M2569 Stiffness of other specified joint, not elsewhere classified: Secondary | ICD-10-CM | POA: Diagnosis not present

## 2019-12-16 DIAGNOSIS — R531 Weakness: Secondary | ICD-10-CM | POA: Diagnosis not present

## 2019-12-16 DIAGNOSIS — M542 Cervicalgia: Secondary | ICD-10-CM | POA: Diagnosis not present

## 2019-12-23 DIAGNOSIS — F331 Major depressive disorder, recurrent, moderate: Secondary | ICD-10-CM | POA: Diagnosis not present

## 2019-12-24 DIAGNOSIS — M542 Cervicalgia: Secondary | ICD-10-CM | POA: Diagnosis not present

## 2019-12-24 DIAGNOSIS — M2569 Stiffness of other specified joint, not elsewhere classified: Secondary | ICD-10-CM | POA: Diagnosis not present

## 2019-12-24 DIAGNOSIS — R531 Weakness: Secondary | ICD-10-CM | POA: Diagnosis not present

## 2019-12-24 DIAGNOSIS — R293 Abnormal posture: Secondary | ICD-10-CM | POA: Diagnosis not present

## 2019-12-30 DIAGNOSIS — M542 Cervicalgia: Secondary | ICD-10-CM | POA: Diagnosis not present

## 2019-12-30 DIAGNOSIS — R293 Abnormal posture: Secondary | ICD-10-CM | POA: Diagnosis not present

## 2019-12-30 DIAGNOSIS — R531 Weakness: Secondary | ICD-10-CM | POA: Diagnosis not present

## 2019-12-30 DIAGNOSIS — M2569 Stiffness of other specified joint, not elsewhere classified: Secondary | ICD-10-CM | POA: Diagnosis not present

## 2020-01-07 DIAGNOSIS — R293 Abnormal posture: Secondary | ICD-10-CM | POA: Diagnosis not present

## 2020-01-07 DIAGNOSIS — M542 Cervicalgia: Secondary | ICD-10-CM | POA: Diagnosis not present

## 2020-01-07 DIAGNOSIS — M2569 Stiffness of other specified joint, not elsewhere classified: Secondary | ICD-10-CM | POA: Diagnosis not present

## 2020-01-07 DIAGNOSIS — R531 Weakness: Secondary | ICD-10-CM | POA: Diagnosis not present

## 2020-01-12 DIAGNOSIS — R531 Weakness: Secondary | ICD-10-CM | POA: Diagnosis not present

## 2020-01-12 DIAGNOSIS — M2569 Stiffness of other specified joint, not elsewhere classified: Secondary | ICD-10-CM | POA: Diagnosis not present

## 2020-01-12 DIAGNOSIS — M542 Cervicalgia: Secondary | ICD-10-CM | POA: Diagnosis not present

## 2020-01-12 DIAGNOSIS — R293 Abnormal posture: Secondary | ICD-10-CM | POA: Diagnosis not present

## 2020-01-14 DIAGNOSIS — R293 Abnormal posture: Secondary | ICD-10-CM | POA: Diagnosis not present

## 2020-01-14 DIAGNOSIS — R531 Weakness: Secondary | ICD-10-CM | POA: Diagnosis not present

## 2020-01-14 DIAGNOSIS — M2569 Stiffness of other specified joint, not elsewhere classified: Secondary | ICD-10-CM | POA: Diagnosis not present

## 2020-01-14 DIAGNOSIS — M542 Cervicalgia: Secondary | ICD-10-CM | POA: Diagnosis not present

## 2020-01-19 DIAGNOSIS — M2569 Stiffness of other specified joint, not elsewhere classified: Secondary | ICD-10-CM | POA: Diagnosis not present

## 2020-01-19 DIAGNOSIS — R531 Weakness: Secondary | ICD-10-CM | POA: Diagnosis not present

## 2020-01-19 DIAGNOSIS — R293 Abnormal posture: Secondary | ICD-10-CM | POA: Diagnosis not present

## 2020-01-19 DIAGNOSIS — M542 Cervicalgia: Secondary | ICD-10-CM | POA: Diagnosis not present

## 2020-01-27 DIAGNOSIS — R531 Weakness: Secondary | ICD-10-CM | POA: Diagnosis not present

## 2020-01-27 DIAGNOSIS — M542 Cervicalgia: Secondary | ICD-10-CM | POA: Diagnosis not present

## 2020-01-27 DIAGNOSIS — M2569 Stiffness of other specified joint, not elsewhere classified: Secondary | ICD-10-CM | POA: Diagnosis not present

## 2020-01-27 DIAGNOSIS — R293 Abnormal posture: Secondary | ICD-10-CM | POA: Diagnosis not present

## 2020-01-28 DIAGNOSIS — R293 Abnormal posture: Secondary | ICD-10-CM | POA: Diagnosis not present

## 2020-01-28 DIAGNOSIS — M2569 Stiffness of other specified joint, not elsewhere classified: Secondary | ICD-10-CM | POA: Diagnosis not present

## 2020-01-28 DIAGNOSIS — M542 Cervicalgia: Secondary | ICD-10-CM | POA: Diagnosis not present

## 2020-01-28 DIAGNOSIS — R531 Weakness: Secondary | ICD-10-CM | POA: Diagnosis not present

## 2020-02-02 DIAGNOSIS — M542 Cervicalgia: Secondary | ICD-10-CM | POA: Diagnosis not present

## 2020-02-02 DIAGNOSIS — R293 Abnormal posture: Secondary | ICD-10-CM | POA: Diagnosis not present

## 2020-02-02 DIAGNOSIS — M2569 Stiffness of other specified joint, not elsewhere classified: Secondary | ICD-10-CM | POA: Diagnosis not present

## 2020-02-02 DIAGNOSIS — R531 Weakness: Secondary | ICD-10-CM | POA: Diagnosis not present

## 2020-02-09 DIAGNOSIS — R293 Abnormal posture: Secondary | ICD-10-CM | POA: Diagnosis not present

## 2020-02-09 DIAGNOSIS — R531 Weakness: Secondary | ICD-10-CM | POA: Diagnosis not present

## 2020-02-09 DIAGNOSIS — M542 Cervicalgia: Secondary | ICD-10-CM | POA: Diagnosis not present

## 2020-02-09 DIAGNOSIS — M2569 Stiffness of other specified joint, not elsewhere classified: Secondary | ICD-10-CM | POA: Diagnosis not present

## 2020-02-10 DIAGNOSIS — Z01419 Encounter for gynecological examination (general) (routine) without abnormal findings: Secondary | ICD-10-CM | POA: Diagnosis not present

## 2020-02-11 DIAGNOSIS — R531 Weakness: Secondary | ICD-10-CM | POA: Diagnosis not present

## 2020-02-11 DIAGNOSIS — R293 Abnormal posture: Secondary | ICD-10-CM | POA: Diagnosis not present

## 2020-02-11 DIAGNOSIS — M542 Cervicalgia: Secondary | ICD-10-CM | POA: Diagnosis not present

## 2020-02-11 DIAGNOSIS — M2569 Stiffness of other specified joint, not elsewhere classified: Secondary | ICD-10-CM | POA: Diagnosis not present

## 2020-02-16 DIAGNOSIS — R293 Abnormal posture: Secondary | ICD-10-CM | POA: Diagnosis not present

## 2020-02-16 DIAGNOSIS — R531 Weakness: Secondary | ICD-10-CM | POA: Diagnosis not present

## 2020-02-16 DIAGNOSIS — M2569 Stiffness of other specified joint, not elsewhere classified: Secondary | ICD-10-CM | POA: Diagnosis not present

## 2020-02-16 DIAGNOSIS — M542 Cervicalgia: Secondary | ICD-10-CM | POA: Diagnosis not present

## 2020-02-25 DIAGNOSIS — R531 Weakness: Secondary | ICD-10-CM | POA: Diagnosis not present

## 2020-02-25 DIAGNOSIS — M2569 Stiffness of other specified joint, not elsewhere classified: Secondary | ICD-10-CM | POA: Diagnosis not present

## 2020-02-25 DIAGNOSIS — R293 Abnormal posture: Secondary | ICD-10-CM | POA: Diagnosis not present

## 2020-02-25 DIAGNOSIS — M542 Cervicalgia: Secondary | ICD-10-CM | POA: Diagnosis not present

## 2020-03-01 DIAGNOSIS — R531 Weakness: Secondary | ICD-10-CM | POA: Diagnosis not present

## 2020-03-01 DIAGNOSIS — R293 Abnormal posture: Secondary | ICD-10-CM | POA: Diagnosis not present

## 2020-03-01 DIAGNOSIS — M542 Cervicalgia: Secondary | ICD-10-CM | POA: Diagnosis not present

## 2020-03-01 DIAGNOSIS — M2569 Stiffness of other specified joint, not elsewhere classified: Secondary | ICD-10-CM | POA: Diagnosis not present

## 2020-03-09 DIAGNOSIS — M542 Cervicalgia: Secondary | ICD-10-CM | POA: Diagnosis not present

## 2020-03-09 DIAGNOSIS — R531 Weakness: Secondary | ICD-10-CM | POA: Diagnosis not present

## 2020-03-09 DIAGNOSIS — M2569 Stiffness of other specified joint, not elsewhere classified: Secondary | ICD-10-CM | POA: Diagnosis not present

## 2020-03-09 DIAGNOSIS — R293 Abnormal posture: Secondary | ICD-10-CM | POA: Diagnosis not present

## 2020-03-15 DIAGNOSIS — R293 Abnormal posture: Secondary | ICD-10-CM | POA: Diagnosis not present

## 2020-03-15 DIAGNOSIS — M2569 Stiffness of other specified joint, not elsewhere classified: Secondary | ICD-10-CM | POA: Diagnosis not present

## 2020-03-15 DIAGNOSIS — M542 Cervicalgia: Secondary | ICD-10-CM | POA: Diagnosis not present

## 2020-03-15 DIAGNOSIS — R531 Weakness: Secondary | ICD-10-CM | POA: Diagnosis not present

## 2020-03-17 DIAGNOSIS — R531 Weakness: Secondary | ICD-10-CM | POA: Diagnosis not present

## 2020-03-17 DIAGNOSIS — R293 Abnormal posture: Secondary | ICD-10-CM | POA: Diagnosis not present

## 2020-03-17 DIAGNOSIS — M542 Cervicalgia: Secondary | ICD-10-CM | POA: Diagnosis not present

## 2020-03-17 DIAGNOSIS — M2569 Stiffness of other specified joint, not elsewhere classified: Secondary | ICD-10-CM | POA: Diagnosis not present

## 2020-03-29 DIAGNOSIS — M542 Cervicalgia: Secondary | ICD-10-CM | POA: Diagnosis not present

## 2020-03-29 DIAGNOSIS — R293 Abnormal posture: Secondary | ICD-10-CM | POA: Diagnosis not present

## 2020-03-29 DIAGNOSIS — R531 Weakness: Secondary | ICD-10-CM | POA: Diagnosis not present

## 2020-03-29 DIAGNOSIS — M2569 Stiffness of other specified joint, not elsewhere classified: Secondary | ICD-10-CM | POA: Diagnosis not present

## 2020-04-05 DIAGNOSIS — R293 Abnormal posture: Secondary | ICD-10-CM | POA: Diagnosis not present

## 2020-04-05 DIAGNOSIS — M542 Cervicalgia: Secondary | ICD-10-CM | POA: Diagnosis not present

## 2020-04-05 DIAGNOSIS — M2569 Stiffness of other specified joint, not elsewhere classified: Secondary | ICD-10-CM | POA: Diagnosis not present

## 2020-04-05 DIAGNOSIS — R531 Weakness: Secondary | ICD-10-CM | POA: Diagnosis not present
